# Patient Record
Sex: Male | Born: 1990 | Race: White | Hispanic: No | Marital: Single | Smoking: Never smoker
Health system: Southern US, Community
[De-identification: ages and names within clinical notes are randomized; demographics above are authoritative.]

## PROBLEM LIST (undated history)

## (undated) DIAGNOSIS — F4310 Post-traumatic stress disorder, unspecified: Secondary | ICD-10-CM

---
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
## 2018-06-26 ENCOUNTER — Emergency Department (HOSPITAL_COMMUNITY)
Admission: EM | Admit: 2018-06-26 | Discharge: 2018-06-26 | Disposition: A | Discharge status: Home / Self Care | Payer: No Typology Code available for payment source | Attending: Emergency Medicine | Admitting: Emergency Medicine

## 2018-06-26 ENCOUNTER — Other Ambulatory Visit: Payer: Self-pay

## 2018-06-26 ENCOUNTER — Emergency Department (HOSPITAL_COMMUNITY): Payer: No Typology Code available for payment source

## 2018-06-26 ENCOUNTER — Encounter (HOSPITAL_COMMUNITY): Payer: Self-pay

## 2018-06-26 DIAGNOSIS — Z23 Encounter for immunization: Secondary | ICD-10-CM | POA: Insufficient documentation

## 2018-06-26 DIAGNOSIS — S9002XA Contusion of left ankle, initial encounter: Secondary | ICD-10-CM | POA: Diagnosis not present

## 2018-06-26 DIAGNOSIS — Y92410 Unspecified street and highway as the place of occurrence of the external cause: Secondary | ICD-10-CM | POA: Diagnosis not present

## 2018-06-26 DIAGNOSIS — Y998 Other external cause status: Secondary | ICD-10-CM | POA: Insufficient documentation

## 2018-06-26 DIAGNOSIS — Y9355 Activity, bike riding: Secondary | ICD-10-CM | POA: Insufficient documentation

## 2018-06-26 DIAGNOSIS — S99812A Other specified injuries of left ankle, initial encounter: Secondary | ICD-10-CM | POA: Diagnosis present

## 2018-06-26 HISTORY — DX: Post-traumatic stress disorder, unspecified: F43.10

## 2018-06-26 MED ORDER — ACETAMINOPHEN 500 MG PO TABS
1000.0000 mg | ORAL_TABLET | Freq: Once | ORAL | Status: AC
  Administered 2018-06-26: 1000 mg via ORAL
  Filled 2018-06-26: qty 2

## 2018-06-26 MED ORDER — TETANUS-DIPHTH-ACELL PERTUSSIS 5-2.5-18.5 LF-MCG/0.5 IM SUSP
0.5000 mL | Freq: Once | INTRAMUSCULAR | Status: AC
  Administered 2018-06-26: 0.5 mL via INTRAMUSCULAR
  Filled 2018-06-26: qty 0.5
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
## 2018-06-26 NOTE — ED Notes (Signed)
Pt ambulated to bathroom with standby assistance.
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
